# Patient Record
Sex: Male | Born: 1960 | Race: White | Hispanic: No | Smoking: Never smoker
Health system: Southern US, Community
[De-identification: ages and names within clinical notes are randomized; demographics above are authoritative.]

## PROBLEM LIST (undated history)

## (undated) DIAGNOSIS — F410 Panic disorder [episodic paroxysmal anxiety] without agoraphobia: Secondary | ICD-10-CM

## (undated) HISTORY — PX: APPENDECTOMY: SHX54

---

## 2017-11-28 ENCOUNTER — Encounter (HOSPITAL_COMMUNITY): Payer: Self-pay | Admitting: *Deleted

## 2017-11-28 ENCOUNTER — Emergency Department (HOSPITAL_COMMUNITY): Payer: Non-veteran care

## 2017-11-28 ENCOUNTER — Other Ambulatory Visit: Payer: Self-pay

## 2017-11-28 DIAGNOSIS — Z5321 Procedure and treatment not carried out due to patient leaving prior to being seen by health care provider: Secondary | ICD-10-CM | POA: Diagnosis not present

## 2017-11-28 DIAGNOSIS — R079 Chest pain, unspecified: Secondary | ICD-10-CM | POA: Insufficient documentation

## 2017-11-28 LAB — BASIC METABOLIC PANEL
ANION GAP: 9 (ref 5–15)
BUN: 8 mg/dL (ref 6–20)
CALCIUM: 9.4 mg/dL (ref 8.9–10.3)
CO2: 27 mmol/L (ref 22–32)
Chloride: 101 mmol/L (ref 101–111)
Creatinine, Ser: 1.09 mg/dL (ref 0.61–1.24)
GFR calc non Af Amer: >60 mL/min (ref >60)
GLUCOSE: 125 mg/dL — AB (ref 65–99)
POTASSIUM: 4.1 mmol/L (ref 3.5–5.1)
Sodium: 137 mmol/L (ref 135–145)

## 2017-11-28 LAB — TROPONIN I

## 2017-11-28 LAB — CBC
HEMATOCRIT: 41 % (ref 39.0–52.0)
HEMOGLOBIN: 13.8 g/dL (ref 13.0–17.0)
MCH: 30.2 pg (ref 26.0–34.0)
MCHC: 33.7 g/dL (ref 30.0–36.0)
MCV: 89.7 fL (ref 78.0–100.0)
Platelets: 216 10*3/uL (ref 150–400)
RBC: 4.57 MIL/uL (ref 4.22–5.81)
RDW: 13.4 % (ref 11.5–15.5)
WBC: 6.7 10*3/uL (ref 4.0–10.5)

## 2017-11-28 NOTE — ED Triage Notes (Signed)
The pt has had dizziness since this am  Lt upper chest tightness for 3 hours. He reports that he has panic attacks but he usually does not have the chest tightness.

## 2017-11-29 ENCOUNTER — Emergency Department (HOSPITAL_COMMUNITY)
Admission: EM | Admit: 2017-11-29 | Discharge: 2017-11-29 | Disposition: A | Discharge status: Home / Self Care | Payer: Non-veteran care | Attending: Emergency Medicine | Admitting: Emergency Medicine

## 2017-11-29 NOTE — ED Notes (Signed)
Pt has decided to leave 

## 2018-01-13 ENCOUNTER — Other Ambulatory Visit: Payer: Self-pay

## 2018-01-13 ENCOUNTER — Encounter (HOSPITAL_COMMUNITY): Payer: Self-pay | Admitting: Emergency Medicine

## 2018-01-13 ENCOUNTER — Emergency Department (HOSPITAL_COMMUNITY): Payer: Non-veteran care

## 2018-01-13 ENCOUNTER — Emergency Department (HOSPITAL_COMMUNITY)
Admission: EM | Admit: 2018-01-13 | Discharge: 2018-01-14 | Disposition: A | Discharge status: Home / Self Care | Payer: Non-veteran care | Attending: Emergency Medicine | Admitting: Emergency Medicine

## 2018-01-13 DIAGNOSIS — R079 Chest pain, unspecified: Secondary | ICD-10-CM | POA: Diagnosis not present

## 2018-01-13 DIAGNOSIS — R200 Anesthesia of skin: Secondary | ICD-10-CM | POA: Insufficient documentation

## 2018-01-13 DIAGNOSIS — R064 Hyperventilation: Secondary | ICD-10-CM | POA: Diagnosis not present

## 2018-01-13 DIAGNOSIS — F410 Panic disorder [episodic paroxysmal anxiety] without agoraphobia: Secondary | ICD-10-CM | POA: Diagnosis not present

## 2018-01-13 HISTORY — DX: Panic disorder (episodic paroxysmal anxiety): F41.0

## 2018-01-13 LAB — I-STAT TROPONIN, ED: TROPONIN I, POC: 0 ng/mL (ref 0.00–0.08)

## 2018-01-13 LAB — BASIC METABOLIC PANEL
ANION GAP: 7 (ref 5–15)
BUN: 8 mg/dL (ref 6–20)
CALCIUM: 9 mg/dL (ref 8.9–10.3)
CHLORIDE: 102 mmol/L (ref 101–111)
CO2: 29 mmol/L (ref 22–32)
Creatinine, Ser: 0.88 mg/dL (ref 0.61–1.24)
GFR calc Af Amer: >60 mL/min (ref >60)
GFR calc non Af Amer: >60 mL/min (ref >60)
GLUCOSE: 114 mg/dL — AB (ref 65–99)
Potassium: 3.9 mmol/L (ref 3.5–5.1)
Sodium: 138 mmol/L (ref 135–145)

## 2018-01-13 LAB — CBC
HCT: 37.3 % — ABNORMAL LOW (ref 39.0–52.0)
HEMOGLOBIN: 12.7 g/dL — AB (ref 13.0–17.0)
MCH: 30.4 pg (ref 26.0–34.0)
MCHC: 34 g/dL (ref 30.0–36.0)
MCV: 89.2 fL (ref 78.0–100.0)
Platelets: 194 10*3/uL (ref 150–400)
RBC: 4.18 MIL/uL — ABNORMAL LOW (ref 4.22–5.81)
RDW: 13.5 % (ref 11.5–15.5)
WBC: 6.9 10*3/uL (ref 4.0–10.5)

## 2018-01-13 NOTE — ED Triage Notes (Signed)
Pt states earlier his face turned red and felt tight, he got a headache, and pressure in his chest  Pt states he has hx of panic attacks so he took an Ativan and Aspirin about 45 minutes ago  Pt states he laid down but the tightness in his chest got worse so he came here  Pt states on the way here he had chills really bad  Denies any cardiac hx or symptoms  Denies pain states it is just some pressure

## 2018-01-14 LAB — I-STAT TROPONIN, ED: Troponin i, poc: 0 ng/mL (ref 0.00–0.08)

## 2018-01-14 NOTE — ED Provider Notes (Signed)
[facility]-EMERGENCY DEPT Provider Note   CSN: [id number] Arrival date & time: 01/13/18  2016     History   Chief Complaint Chief Complaint  Patient presents with  . Panic Attack    HPI Travis Collins is a 57 y.o. male.  Patient is a 57 year old male with past medical history of PTSD, anxiety, panic disorder presenting for evaluation of possible panic attack.  He reports this evening at home developing tightness in his chest, numbness in his face and hands, and hyperventilation.  This episode lasted for approximately 30 minutes, then resolved.  He now feels fine.  He has had similar episodes in the past, however this seemed somewhat worse than normal and presents for evaluation of it.  He denies any prior cardiac history.   The history is provided by the patient.    Past Medical History:  Diagnosis Date  . Panic attacks     There are no active problems to display for this patient.   Past Surgical History:  Procedure Laterality Date  . APPENDECTOMY         Home Medications    Prior to Admission medications   Not on File    Family History Family History  Adopted: Yes  Problem Relation Age of Onset  . Aortic aneurysm Mother   . Stroke Maternal Grandmother     Social History Social History   Tobacco Use  . Smoking status: Never Smoker  . Smokeless tobacco: Never Used  Substance Use Topics  . Alcohol use: No    Frequency: Never  . Drug use: No     Allergies   Patient has no known allergies.   Review of Systems Review of Systems  All other systems reviewed and are negative.    Physical Exam Updated Vital Signs BP (!) 142/81 (BP Location: Left Arm)   Pulse 78   Temp 98.5 F (36.9 C) (Oral)   Resp 20   SpO2 99%   Physical Exam  Constitutional: He is oriented to person, place, and time. He appears well-developed and well-nourished. No distress.  HENT:  Head: Normocephalic and atraumatic.  Mouth/Throat: Oropharynx is  clear and moist.  Neck: Normal range of motion. Neck supple.  Cardiovascular: Normal rate and regular rhythm. Exam reveals no friction rub.  No murmur heard. Pulmonary/Chest: Effort normal and breath sounds normal. No respiratory distress. He has no wheezes. He has no rales.  Abdominal: Soft. Bowel sounds are normal. He exhibits no distension. There is no tenderness.  Musculoskeletal: Normal range of motion. He exhibits no edema.  Neurological: He is alert and oriented to person, place, and time. Coordination normal.  Skin: Skin is warm and dry. He is not diaphoretic.  Nursing note and vitals reviewed.    ED Treatments / Results  Labs (all labs ordered are listed, but only abnormal results are displayed) Labs Reviewed  BASIC METABOLIC PANEL - Abnormal; Notable for the following components:      Result Value   Glucose, Bld 114 (*)    All other components within normal limits  CBC - Abnormal; Notable for the following components:   RBC 4.18 (*)    Hemoglobin 12.7 (*)    HCT 37.3 (*)    All other components within normal limits  I-STAT TROPONIN, ED  I-STAT TROPONIN, ED    EKG  EKG Interpretation  Date/Time:  Thursday January 13 2018 21:01:13 EST Ventricular Rate:  73 PR Interval:    QRS Duration: 96 QT Interval:  388 QTC Calculation: 428 R Axis:   6 Text Interpretation:  Sinus rhythm Confirmed by Geoffery LyonseLo, Juanice Warburton ([id number]) on 01/13/2018 11:46:44 PM       Radiology Dg Chest 2 View  Result Date: 01/13/2018 CLINICAL DATA:  Chest tightness several hours ago EXAM: CHEST  2 VIEW COMPARISON:  November 28, 2017 FINDINGS: The heart size and mediastinal contours are within normal limits. There is a calcified granuloma in the lateral left lung base. There is no focal infiltrate, pulmonary edema, or pleural effusion. The visualized skeletal structures are unremarkable. IMPRESSION: No active cardiopulmonary disease. Electronically Signed   By: Sherian ReinWei-Chen  Lin M.D.   On: 01/13/2018 22:01     Procedures Procedures (including critical care time)  Medications Ordered in ED Medications - No data to display   Initial Impression / Assessment and Plan / ED Course  I have reviewed the triage vital signs and the nursing notes.  Pertinent labs & imaging results that were available during my care of the patient were reviewed by me and considered in my medical decision making (see chart for details).  Patient second troponin is negative.  I highly suspect a panic attack as the etiology of his symptoms.  He agrees with this.  He now feels fine and has no complaints.  I believe he is appropriate for discharge.  He is to follow-up as needed for any problems.  Final Clinical Impressions(s) / ED Diagnoses   Final diagnoses:  None    ED Discharge Orders    None       Geoffery Lyonselo, Teven Mittman, MD 01/14/18 [phone]

## 2018-01-14 NOTE — Discharge Instructions (Signed)
Continue your medications as previously prescribed.  Return to the ER if symptoms significantly worsen or change.

## 2018-12-26 ENCOUNTER — Other Ambulatory Visit: Payer: Self-pay | Admitting: Nurse Practitioner

## 2018-12-26 DIAGNOSIS — R9721 Rising PSA following treatment for malignant neoplasm of prostate: Secondary | ICD-10-CM

## 2019-01-10 ENCOUNTER — Ambulatory Visit
Admission: RE | Admit: 2019-01-10 | Discharge: 2019-01-10 | Disposition: A | Discharge status: Home / Self Care | Payer: Non-veteran care | Source: Ambulatory Visit | Attending: Nurse Practitioner | Admitting: Nurse Practitioner

## 2019-01-10 DIAGNOSIS — R9721 Rising PSA following treatment for malignant neoplasm of prostate: Secondary | ICD-10-CM

## 2019-01-10 MED ORDER — GADOBENATE DIMEGLUMINE 529 MG/ML IV SOLN
20.0000 mL | Freq: Once | INTRAVENOUS | Status: AC | PRN
  Administered 2019-01-10: 20 mL via INTRAVENOUS

## 2019-07-11 IMAGING — MR MR PROSTATE WO/W CM
56 series · 56 of 56 positions shown · IV contrast (20 ml multihance)
Comparison: None.

CLINICAL DATA: Increasing PSA. Prostate carcinoma. Negative
prostate biopsy 02/04/2018

EXAM:
MR PROSTATE WITHOUT AND WITH CONTRAST
TECHNIQUE: Multiplanar multisequence MRI images were obtained of the pelvis
centered about the prostate. Pre and post contrast images were
obtained.
CONTRAST:  20mL MULTIHANCE GADOBENATE DIMEGLUMINE 529 MG/ML IV SOLN

[Series 3: T1 · axial · 8.0mm · 1.06mm/px · 1 of 28 slices shown (1 of 2)]
[im 1/28]
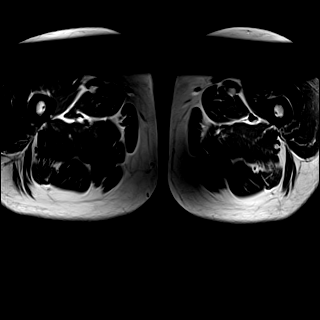

[Series 4: bSSFP fat-sat · axial · 8.0mm · 0.74mm/px · 1 of 28 slices shown]
[im 1/28]
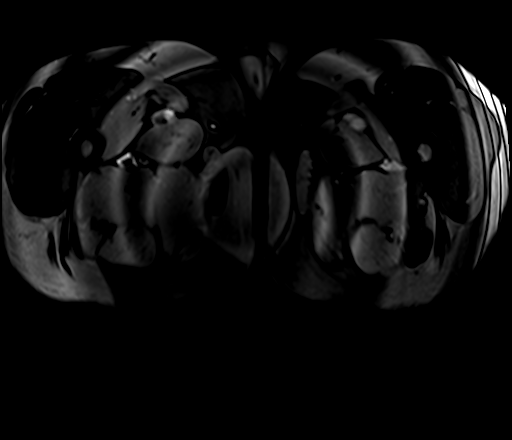

[Series 5: T2 · sagittal · 3.5mm · 0.56mm/px · 1 of 39 slices shown (1 of 4)]
[im 1/39]
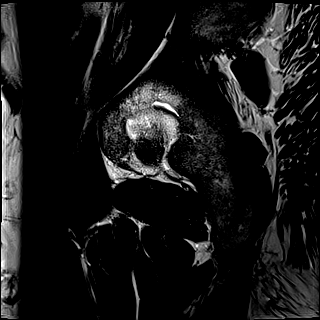

[Series 6: T1 · axial · 3.0mm · 0.31mm/px · 1 of 24 slices shown (2 of 2)]
[im 1/24]
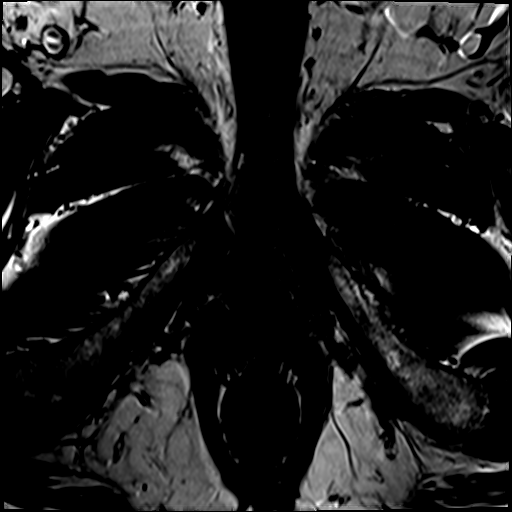

[Series 7: T2 · axial · 3.5mm · 0.56mm/px · 1 of 23 slices shown (2 of 4)]
[im 1/23]
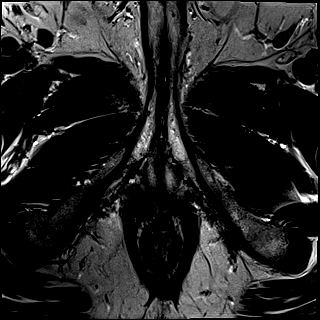

[Series 8: T2 · axial · 1.0mm · 1.04mm/px · 1 of 80 slices shown (3 of 4)]
[im 1/80]
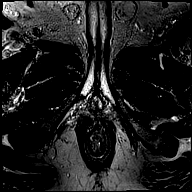

[Series 9: T2 · coronal · 3.5mm · 0.56mm/px · 1 of 23 slices shown (4 of 4)]
[im 1/23]
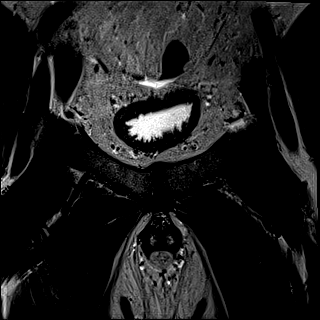

[Series 10: DWI · axial · 3.5mm · 1.56mm/px · 1 of 59 slices shown (1 of 2)]
[im 1/59]
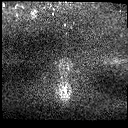

[Series 11: DWI · axial · 3.5mm · 1.56mm/px · 1 of 20 slices shown (2 of 2)]
[im 1/20]
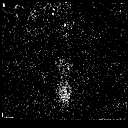

[Series 12: pre t1_twist_tra_dyn_ttc=5.3s · axial · non-contrast · 3.5mm · 0.83mm/px · 1 of 20 slices shown]
[im 1/20]
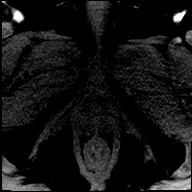

[Series 13: post t1_twist_tra_dyn-copy center · axial · 3.5mm · 0.83mm/px · 1 of 20 slices shown (1 of 24)]
[im 1/20]
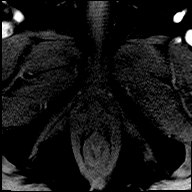

[Series 14: post t1_twist_tra_dyn-copy center · axial · 3.5mm · 0.83mm/px · 1 of 20 slices shown (2 of 24)]
[im 1/20]
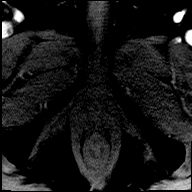

[Series 15: post t1_twist_tra_dyn-copy cent_sub_ttc=(id) · axial · 3.5mm · 0.83mm/px · 1 of 20 slices shown (1 of 22)]
[im 1/20]
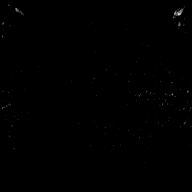

[Series 16: post t1_twist_tra_dyn-copy center · axial · 3.5mm · 0.83mm/px · 1 of 20 slices shown (3 of 24)]
[im 1/20]
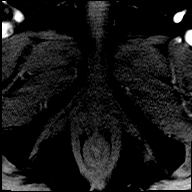

[Series 17: post t1_twist_tra_dyn-copy cent_sub_ttc=(id) · axial · 3.5mm · 0.83mm/px · 1 of 20 slices shown (2 of 22)]
[im 1/20]
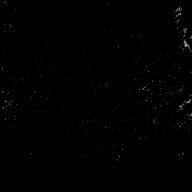

[Series 18: post t1_twist_tra_dyn-copy center · axial · 3.5mm · 0.83mm/px · 1 of 20 slices shown (4 of 24)]
[im 1/20]
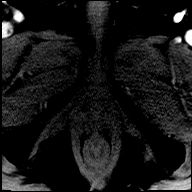

[Series 19: post t1_twist_tra_dyn-copy cent_sub_ttc=(id) · axial · 3.5mm · 0.83mm/px · 1 of 20 slices shown (3 of 22)]
[im 1/20]
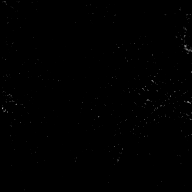

[Series 20: post t1_twist_tra_dyn-copy center · axial · 3.5mm · 0.83mm/px · 1 of 20 slices shown (5 of 24)]
[im 1/20]
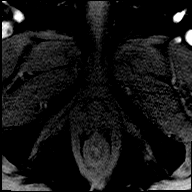

[Series 21: post t1_twist_tra_dyn-copy cent_sub_ttc=(id) · axial · 3.5mm · 0.83mm/px · 1 of 20 slices shown (4 of 22)]
[im 1/20]
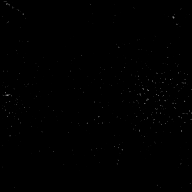

[Series 22: post t1_twist_tra_dyn-copy center · axial · 3.5mm · 0.83mm/px · 1 of 20 slices shown (6 of 24)]
[im 1/20]
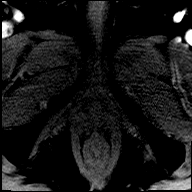

[Series 23: post t1_twist_tra_dyn-copy cent_sub_ttc=(id) · axial · 3.5mm · 0.83mm/px · 1 of 20 slices shown (5 of 22)]
[im 1/20]
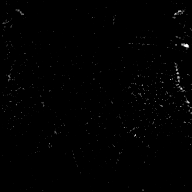

[Series 24: post t1_twist_tra_dyn-copy center · axial · 3.5mm · 0.83mm/px · 1 of 20 slices shown (7 of 24)]
[im 1/20]
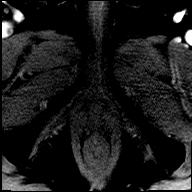

[Series 25: post t1_twist_tra_dyn-copy cent_sub_ttc=(id) · axial · 3.5mm · 0.83mm/px · 1 of 20 slices shown (6 of 22)]
[im 1/20]
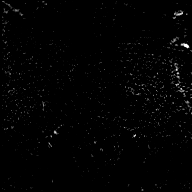

[Series 26: post t1_twist_tra_dyn-copy center · axial · 3.5mm · 0.83mm/px · 1 of 20 slices shown (8 of 24)]
[im 1/20]
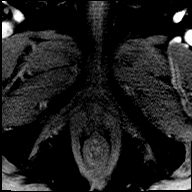

[Series 27: post t1_twist_tra_dyn-copy cent_sub_ttc=(id) · axial · 3.5mm · 0.83mm/px · 1 of 20 slices shown (7 of 22)]
[im 1/20]
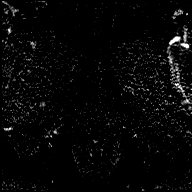

[Series 28: post t1_twist_tra_dyn-copy center · axial · 3.5mm · 0.83mm/px · 1 of 20 slices shown (9 of 24)]
[im 1/20]
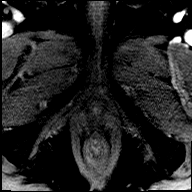

[Series 29: post t1_twist_tra_dyn-copy cent_sub_ttc=(id) · axial · 3.5mm · 0.83mm/px · 1 of 20 slices shown (8 of 22)]
[im 1/20]
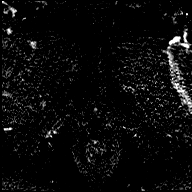

[Series 30: post t1_twist_tra_dyn-copy center · axial · 3.5mm · 0.83mm/px · 1 of 20 slices shown (10 of 24)]
[im 1/20]
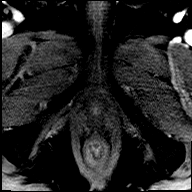

[Series 31: post t1_twist_tra_dyn-copy cent_sub_ttc=(id) · axial · 3.5mm · 0.83mm/px · 1 of 20 slices shown (9 of 22)]
[im 1/20]
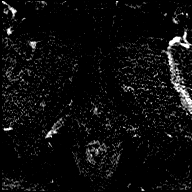

[Series 32: post t1_twist_tra_dyn-copy center · axial · 3.5mm · 0.83mm/px · 1 of 20 slices shown (11 of 24)]
[im 1/20]
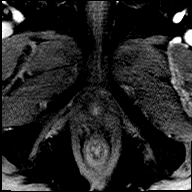

[Series 33: post t1_twist_tra_dyn-copy cent_sub_ttc=(id) · axial · 3.5mm · 0.83mm/px · 1 of 20 slices shown (10 of 22)]
[im 1/20]
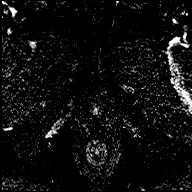

[Series 34: post t1_twist_tra_dyn-copy center · axial · 3.5mm · 0.83mm/px · 1 of 20 slices shown (12 of 24)]
[im 1/20]
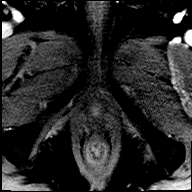

[Series 35: post t1_twist_tra_dyn-copy cent_sub_ttc=(id) · axial · 3.5mm · 0.83mm/px · 1 of 20 slices shown (11 of 22)]
[im 1/20]
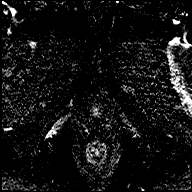

[Series 36: post t1_twist_tra_dyn-copy center · axial · 3.5mm · 0.83mm/px · 1 of 20 slices shown (13 of 24)]
[im 1/20]
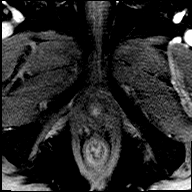

[Series 37: post t1_twist_tra_dyn-copy cent_sub_ttc=(id) · axial · 3.5mm · 0.83mm/px · 1 of 20 slices shown (12 of 22)]
[im 1/20]
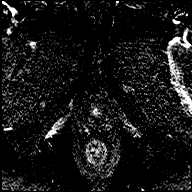

[Series 38: post t1_twist_tra_dyn-copy center · axial · 3.5mm · 0.83mm/px · 1 of 20 slices shown (14 of 24)]
[im 1/20]
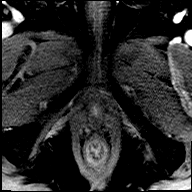

[Series 39: post t1_twist_tra_dyn-copy cent_sub_ttc=(id) · axial · 3.5mm · 0.83mm/px · 1 of 20 slices shown (13 of 22)]
[im 1/20]
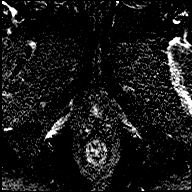

[Series 40: post t1_twist_tra_dyn-copy center · axial · 3.5mm · 0.83mm/px · 1 of 20 slices shown (15 of 24)]
[im 1/20]
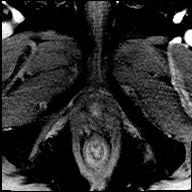

[Series 41: post t1_twist_tra_dyn-copy cent_sub_ttc=(id) · axial · 3.5mm · 0.83mm/px · 1 of 20 slices shown (14 of 22)]
[im 1/20]
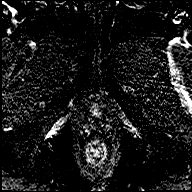

[Series 42: post t1_twist_tra_dyn-copy center · axial · 3.5mm · 0.83mm/px · 1 of 20 slices shown (16 of 24)]
[im 1/20]
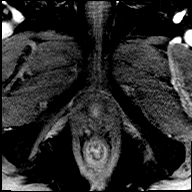

[Series 43: post t1_twist_tra_dyn-copy cent_sub_ttc=(id) · axial · 3.5mm · 0.83mm/px · 1 of 20 slices shown (15 of 22)]
[im 1/20]
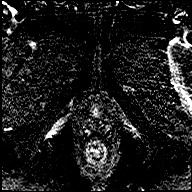

[Series 44: post t1_twist_tra_dyn-copy center · axial · 3.5mm · 0.83mm/px · 1 of 20 slices shown (17 of 24)]
[im 1/20]
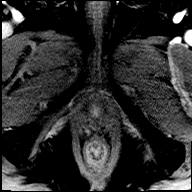

[Series 45: post t1_twist_tra_dyn-copy cent_sub_ttc=(id) · axial · 3.5mm · 0.83mm/px · 1 of 20 slices shown (16 of 22)]
[im 1/20]
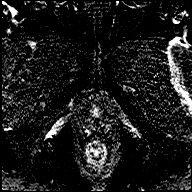

[Series 46: post t1_twist_tra_dyn-copy center · axial · 3.5mm · 0.83mm/px · 1 of 20 slices shown (18 of 24)]
[im 1/20]
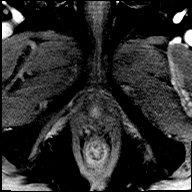

[Series 47: post t1_twist_tra_dyn-copy cent_sub_ttc=(id) · axial · 3.5mm · 0.83mm/px · 1 of 20 slices shown (17 of 22)]
[im 1/20]
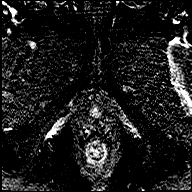

[Series 48: post t1_twist_tra_dyn-copy center · axial · 3.5mm · 0.83mm/px · 1 of 20 slices shown (19 of 24)]
[im 1/20]
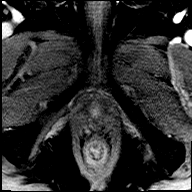

[Series 49: post t1_twist_tra_dyn-copy cent_sub_ttc=(id) · axial · 3.5mm · 0.83mm/px · 1 of 20 slices shown (18 of 22)]
[im 1/20]
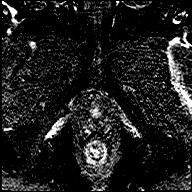

[Series 50: post t1_twist_tra_dyn-copy center · axial · 3.5mm · 0.83mm/px · 1 of 20 slices shown (20 of 24)]
[im 1/20]
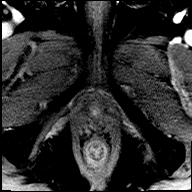

[Series 51: post t1_twist_tra_dyn-copy cent_sub_ttc=(id) · axial · 3.5mm · 0.83mm/px · 1 of 20 slices shown (19 of 22)]
[im 1/20]
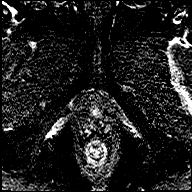

[Series 52: post t1_twist_tra_dyn-copy center · axial · 3.5mm · 0.83mm/px · 1 of 20 slices shown (21 of 24)]
[im 1/20]
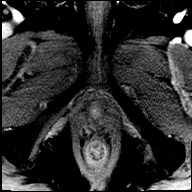

[Series 53: post t1_twist_tra_dyn-copy cent_sub_ttc=(id) · axial · 3.5mm · 0.83mm/px · 1 of 20 slices shown (20 of 22)]
[im 1/20]
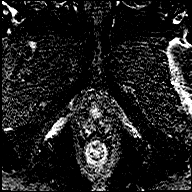

[Series 54: post t1_twist_tra_dyn-copy center · axial · 3.5mm · 0.83mm/px · 1 of 20 slices shown (22 of 24)]
[im 1/20]
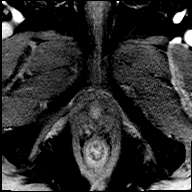

[Series 55: post t1_twist_tra_dyn-copy cent_sub_ttc=(id) · axial · 3.5mm · 0.83mm/px · 1 of 20 slices shown (21 of 22)]
[im 1/20]
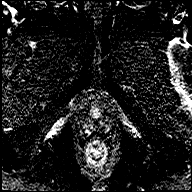

[Series 56: post t1_twist_tra_dyn-copy center · axial · 3.5mm · 0.83mm/px · 1 of 20 slices shown (23 of 24)]
[im 1/20]
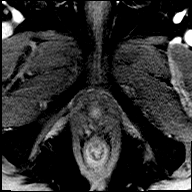

[Series 57: post t1_twist_tra_dyn-copy cent_sub_ttc=(id) · axial · 3.5mm · 0.83mm/px · 1 of 20 slices shown (22 of 22)]
[im 1/20]
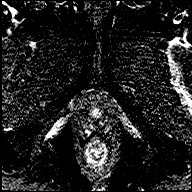

[Series 58: post t1_twist_tra_dyn-copy center · axial · 3.5mm · 0.83mm/px · 1 of 20 slices shown (24 of 24)]
[im 1/20]
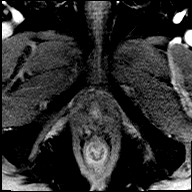

[56 of 56 positions shown; findings below may reference images not displayed]

FINDINGS: Prostate: Within the RIGHT mid gland peripheral zone, there is a low
signal intensity lesion on T2 weighted imaging measuring 4 mm x 5 mm
(image [DATE]). This lesion is at the junction of the peripheral zone
and transitional zone. Small focus of loss signal intensity on the
ADC map at this same level suggests restricted diffusion. There is
potential mild enhancement at this site additionally.

There is no clear additional regions of restricted diffusion in the
peripheral zone. The transitional zone is nodular with capsulated
nodules. No restricted diffusion.

Prostatic capsule is intact.  Seminal vesicles are normal.

Volume: 4.5 x  3.1 x 5.4 cm (volume = 39 cm^3)

Transcapsular spread:  Absent

Seminal vesicle involvement: Absent

Neurovascular bundle involvement: Absent

Pelvic adenopathy: Absent

Bone metastasis: Absent

Other findings: None
IMPRESSION: 1. Small focus of restricted diffusion in the RIGHT mid gland is
concerning for high-grade carcinoma (PI-RADS [DATE]). 3D post
processing (Dynacad) performed.
2. Nodular transitional zone most consistent benign prostate
hypertrophy
